# Patient Record
Sex: Male | Born: 2000
Health system: Southern US, Community
[De-identification: ages and names within clinical notes are randomized; demographics above are authoritative.]

## PROBLEM LIST (undated history)

## (undated) DIAGNOSIS — J329 Chronic sinusitis, unspecified: Secondary | ICD-10-CM

## (undated) DIAGNOSIS — Z13828 Encounter for screening for other musculoskeletal disorder: Secondary | ICD-10-CM

## (undated) DIAGNOSIS — Z8489 Family history of other specified conditions: Secondary | ICD-10-CM

## (undated) DIAGNOSIS — S0300XA Dislocation of jaw, unspecified side, initial encounter: Secondary | ICD-10-CM

## (undated) DIAGNOSIS — H6690 Otitis media, unspecified, unspecified ear: Secondary | ICD-10-CM

## (undated) DIAGNOSIS — J310 Chronic rhinitis: Secondary | ICD-10-CM

## (undated) HISTORY — PX: TEAR DUCT PROBING: SHX793

## (undated) HISTORY — PX: TONSILLECTOMY: SUR1361

## (undated) HISTORY — PX: MYRINGOTOMY WITH TUBE PLACEMENT: SHX5663

---
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
## 2022-12-23 DIAGNOSIS — S9031XA Contusion of right foot, initial encounter: Secondary | ICD-10-CM | POA: Diagnosis not present

## 2022-12-23 DIAGNOSIS — S90121A Contusion of right lesser toe(s) without damage to nail, initial encounter: Secondary | ICD-10-CM | POA: Diagnosis not present

## 2022-12-23 DIAGNOSIS — M79671 Pain in right foot: Secondary | ICD-10-CM | POA: Diagnosis not present

## 2022-12-24 ENCOUNTER — Other Ambulatory Visit: Payer: Self-pay

## 2022-12-24 MED ORDER — CYCLOBENZAPRINE HCL 5 MG PO TABS
10.0000 mg | ORAL_TABLET | Freq: Every day | ORAL | 0 refills | Status: AC
  Filled 2022-12-24: qty 21, 10d supply, fill #0

## 2022-12-31 ENCOUNTER — Other Ambulatory Visit: Payer: Self-pay

## 2024-03-14 ENCOUNTER — Other Ambulatory Visit: Payer: Self-pay

## 2024-03-14 ENCOUNTER — Telehealth: Admitting: Physician Assistant

## 2024-03-14 DIAGNOSIS — L309 Dermatitis, unspecified: Secondary | ICD-10-CM

## 2024-03-14 MED ORDER — BETAMETHASONE DIPROPIONATE 0.05 % EX CREA
TOPICAL_CREAM | Freq: Two times a day (BID) | CUTANEOUS | 0 refills | Status: AC
  Filled 2024-03-14: qty 30, 30d supply, fill #0

## 2024-03-14 NOTE — Progress Notes (Signed)
 I have spent 5 minutes in review of e-visit questionnaire, review and updating patient chart, medical decision making and response to patient.   Piedad Climes, PA-C

## 2024-03-14 NOTE — Progress Notes (Signed)
E-Visit for Eczema  We are sorry that you are not feeling well. Here is how we plan to help! Based on what you shared with me it looks like you have eczema (atopic dermatitis).  Although the cause of eczema is not completely understood, genetics appear to play a strong role, and people with a family history of eczema are at increased risk of developing the condition. In most people with eczema, there is a genetic abnormality in the outermost layer of the skin, called the epidermis   Most people with eczema develop their first symptoms as children, before the age of 17. Intense itching of the skin, patches of redness, small bumps, and skin flaking are common. Scratching can further inflame the skin and worsen the itching. The itchiness may be more noticeable at nighttime.  Eczema commonly affects the back of the neck, the elbow creases, and the backs of the knees. Other affected areas may include the face, wrists, and forearms. The skin may become thickened and darkened, or even scarred, from repeated scratching. Eliminating factors that aggravate your eczema symptoms can help to control the symptoms. Possible triggers may include: ? Cold or dry environments ? Sweating ? Emotional stress or anxiety ? Rapid temperature changes ? Exposure to certain chemicals or cleaning solutions, including soaps and detergents, perfumes and cosmetics, wool or synthetic fibers, dust, sand, and cigarette smoke Keeping your skin hydrated Emollients -- Emollients are creams and ointments that moisturize the skin and prevent it from drying out. The best emollients for people with eczema are thick creams (such as Eucerin, Cetaphil, and Nutraderm) or ointments (such as petroleum jelly, Aquaphor, and Vaseline), which contain little to no water. Emollients are most effective when applied immediately after bathing. Emollients can be applied twice a day or more often if needed. Lotions contain more water than creams and  ointments and are less effective for moisturizing the skin. Bathing -- It is not clear if showers or baths are better for keeping the skin hydrated. Lukewarm baths or showers can hydrate and cool the skin, temporarily relieving itching from eczema. An unscented, mild soap or non-soap cleanser (such as Cetaphil) should be used sparingly. Apply an emollient immediately after bathing or showering to prevent your skin from drying out as a result of water evaporation. Emollient bath additives (products you add to the bath water) have not been found to help relieve symptoms. Hot or long baths (more than 10 to 15 minutes) and showers should be avoided since they can dry out the skin.  Based on what you shared with me you may have eczema.   I have prescribed: and Betamethasone ointment (or cream).  Apply to effected areas twice per day   I recommend dilute bleach baths for people with eczema. These baths help to decrease the number of bacteria on the skin that can cause infections or worsen symptoms. To prepare a bleach bath, one-fourth to one-half cup of bleach is placed in a full bathtub (about 40 gallons) of water. Bleach baths are usually taken for 5 to 10 minutes twice per week and should be followed by application of an emollient (listed above). I recommend you take Benadryl 25mg  - 50mg  every 4 hours to control the symptoms (including itching) but if they last over 24 hours it is best that you see an office based provider for follow up.  HOME CARE: . Take lukewarm showers or baths . Apply creams and ointments to prevent the skin from drying (Eucerin, Cetaphil, Nutraderm, petroleum  jelly, Aquaphor or Vaseline) - these products contain less water than other lotions and are more effective for moisturizing the skin . Limit exposure to cold or dry environments, sweating, emotional stress and anxiety, rapid temperature changes and exposure to chemicals and cleaning products, soaps and detergents, perfumes,  cosmetics, wool and synthetic fibers, dust, sand and cigarette- factors which can aggravate eczema symptoms.  . Use a hydrocortisone cream once or twice a day . Take an antihistamine like Benadryl for widespread rashes that itch.  The adult dosage of Benadryl is 25-50 mg by mouth 4 times daily. Caution: This type of medication may cause sleepiness.  Do not drink alcohol, drive, or operate dangerous machinery while taking antihistamines.  Do not take these medications if you have prostate enlargement.  Read the package instructions thoroughly on all medications that you take.  GET HELP RIGHT AWAY IF: . Symptoms that don't go away after treatment. . Severe itching that persists. . You develop a fever. . Your skin begins to drain. . You have a sore throat. . You become short of breath.  MAKE SURE YOU    Understand these instructions.  Will watch your condition.  Will get help right away if you are not doing well or get worse.  Thank you for choosing an e-visit. Your e-visit answers were reviewed by a board certified advanced clinical practitioner to complete your personal care plan. Depending upon the condition, your plan could have included both over the counter or prescription medications. Please review your pharmacy choice. Make sure the pharmacy is open so you can pick up prescription now. If there is a problem, you may contact your provider through [employer] and have the prescription routed to another pharmacy. Your safety is important to [place]. If you have drug allergies check your prescription carefully.  For the next 24 hours you can use MyChart to ask questions about today's visit, request a non-urgent call back, or ask for a work or school excuse. You will get an email in the next two days asking about your experience. I hope that your e-visit has been valuable and will speed your recovery

## 2024-07-03 DIAGNOSIS — Z0000 Encounter for general adult medical examination without abnormal findings: Secondary | ICD-10-CM | POA: Diagnosis not present

## 2024-07-03 DIAGNOSIS — Z113 Encounter for screening for infections with a predominantly sexual mode of transmission: Secondary | ICD-10-CM | POA: Diagnosis not present

## 2024-07-03 DIAGNOSIS — Z1329 Encounter for screening for other suspected endocrine disorder: Secondary | ICD-10-CM | POA: Diagnosis not present

## 2024-07-03 DIAGNOSIS — Z131 Encounter for screening for diabetes mellitus: Secondary | ICD-10-CM | POA: Diagnosis not present

## 2024-07-03 DIAGNOSIS — R400 Somnolence: Secondary | ICD-10-CM | POA: Diagnosis not present

## 2024-07-03 DIAGNOSIS — Z130 Encounter for screening for diseases of the blood and blood-forming organs and certain disorders involving the immune mechanism: Secondary | ICD-10-CM | POA: Diagnosis not present

## 2024-07-03 DIAGNOSIS — Z13220 Encounter for screening for lipoid disorders: Secondary | ICD-10-CM | POA: Diagnosis not present

## 2024-07-03 DIAGNOSIS — Z1331 Encounter for screening for depression: Secondary | ICD-10-CM | POA: Diagnosis not present

## 2024-07-04 DIAGNOSIS — H5203 Hypermetropia, bilateral: Secondary | ICD-10-CM | POA: Diagnosis not present
